# Patient Record
Sex: Female | Born: 1970 | Race: Black or African American | Hispanic: No | Marital: Single | Smoking: Never smoker
Health system: Southern US, Community
[De-identification: ages and names within clinical notes are randomized; demographics above are authoritative.]

---

## 2001-04-09 DIAGNOSIS — IMO0002 Reserved for concepts with insufficient information to code with codable children: Secondary | ICD-10-CM | POA: Insufficient documentation

## 2001-07-07 DIAGNOSIS — D279 Benign neoplasm of unspecified ovary: Secondary | ICD-10-CM | POA: Insufficient documentation

## 2004-09-05 ENCOUNTER — Emergency Department (HOSPITAL_COMMUNITY): Admission: EM | Admit: 2004-09-05 | Discharge: 2004-09-05 | Payer: Self-pay | Admitting: Emergency Medicine

## 2007-10-26 ENCOUNTER — Ambulatory Visit: Payer: Self-pay | Admitting: Internal Medicine

## 2007-10-26 DIAGNOSIS — I10 Essential (primary) hypertension: Secondary | ICD-10-CM | POA: Insufficient documentation

## 2007-10-26 LAB — CONVERTED CEMR LAB
Protein, U semiquant: NEGATIVE
Urobilinogen, UA: 0.2
WBC Urine, dipstick: NEGATIVE
pH: 7.5

## 2007-10-30 ENCOUNTER — Ambulatory Visit: Payer: Self-pay | Admitting: *Deleted

## 2007-10-30 LAB — CONVERTED CEMR LAB
CO2: 25 meq/L (ref 19–32)
Chloride: 100 meq/L (ref 96–112)
Potassium: 3.9 meq/L (ref 3.5–5.3)
Sodium: 140 meq/L (ref 135–145)

## 2007-11-01 ENCOUNTER — Ambulatory Visit: Payer: Self-pay | Admitting: Internal Medicine

## 2007-11-01 DIAGNOSIS — R7309 Other abnormal glucose: Secondary | ICD-10-CM | POA: Insufficient documentation

## 2007-11-01 LAB — CONVERTED CEMR LAB: Blood Glucose, AC Bkfst: 81 mg/dL

## 2007-12-04 ENCOUNTER — Ambulatory Visit: Payer: Self-pay | Admitting: Internal Medicine

## 2007-12-04 ENCOUNTER — Encounter (INDEPENDENT_AMBULATORY_CARE_PROVIDER_SITE_OTHER): Payer: Self-pay | Admitting: Internal Medicine

## 2007-12-04 DIAGNOSIS — R609 Edema, unspecified: Secondary | ICD-10-CM

## 2007-12-04 LAB — CONVERTED CEMR LAB
ALT: 18 units/L (ref 0–35)
AST: 14 units/L (ref 0–37)
Alkaline Phosphatase: 56 units/L (ref 39–117)
BUN: 12 mg/dL (ref 6–23)
Basophils Relative: 0 % (ref 0–1)
Bilirubin Urine: NEGATIVE
Blood in Urine, dipstick: NEGATIVE
CO2: 22 meq/L (ref 19–32)
Chloride: 103 meq/L (ref 96–112)
Creatinine, Ser: 0.75 mg/dL (ref 0.40–1.20)
Glucose, Urine, Semiquant: NEGATIVE
HCT: 40 % (ref 36.0–46.0)
HDL: 43 mg/dL (ref >39)
Ketones, urine, test strip: NEGATIVE
LDL Cholesterol: 138 mg/dL — ABNORMAL HIGH (ref 0–99)
MCHC: 32.3 g/dL (ref 30.0–36.0)
MCV: 85.7 fL (ref 78.0–100.0)
Neutrophils Relative %: 52 % (ref 43–77)
Nitrite: NEGATIVE
Platelets: 293 10*3/uL (ref 150–400)
Potassium: 4.4 meq/L (ref 3.5–5.3)
Sodium: 139 meq/L (ref 135–145)
Specific Gravity, Urine: >=1.03
TSH: 3.584 microintl units/mL (ref 0.350–4.50)
Total Bilirubin: 0.2 mg/dL — ABNORMAL LOW (ref 0.3–1.2)
Urobilinogen, UA: NEGATIVE

## 2007-12-06 ENCOUNTER — Encounter (INDEPENDENT_AMBULATORY_CARE_PROVIDER_SITE_OTHER): Payer: Self-pay | Admitting: Internal Medicine

## 2007-12-25 ENCOUNTER — Ambulatory Visit: Payer: Self-pay | Admitting: Internal Medicine

## 2008-01-04 ENCOUNTER — Ambulatory Visit: Payer: Self-pay | Admitting: Internal Medicine

## 2008-01-04 LAB — CONVERTED CEMR LAB
BUN: 17 mg/dL (ref 6–23)
Glucose, Bld: 80 mg/dL (ref 70–99)
Potassium: 5.3 meq/L (ref 3.5–5.3)

## 2008-01-07 ENCOUNTER — Ambulatory Visit: Payer: Self-pay | Admitting: Internal Medicine

## 2008-02-22 ENCOUNTER — Ambulatory Visit: Payer: Self-pay | Admitting: Internal Medicine

## 2008-02-22 DIAGNOSIS — N926 Irregular menstruation, unspecified: Secondary | ICD-10-CM | POA: Insufficient documentation

## 2008-02-22 DIAGNOSIS — N939 Abnormal uterine and vaginal bleeding, unspecified: Secondary | ICD-10-CM

## 2008-03-24 ENCOUNTER — Ambulatory Visit: Payer: Self-pay | Admitting: Internal Medicine

## 2008-04-07 ENCOUNTER — Ambulatory Visit: Payer: Self-pay | Admitting: Internal Medicine

## 2008-05-29 ENCOUNTER — Telehealth (INDEPENDENT_AMBULATORY_CARE_PROVIDER_SITE_OTHER): Payer: Self-pay | Admitting: Internal Medicine

## 2008-06-02 ENCOUNTER — Encounter (INDEPENDENT_AMBULATORY_CARE_PROVIDER_SITE_OTHER): Payer: Self-pay | Admitting: Internal Medicine

## 2008-12-26 ENCOUNTER — Ambulatory Visit: Payer: Self-pay | Admitting: Internal Medicine

## 2008-12-29 ENCOUNTER — Ambulatory Visit: Payer: Self-pay | Admitting: Physician Assistant

## 2012-01-30 ENCOUNTER — Ambulatory Visit: Payer: Self-pay | Admitting: Specialist

## 2012-01-30 LAB — CBC WITH DIFFERENTIAL/PLATELET
Eosinophil #: 0.2 10*3/uL (ref 0.0–0.7)
HCT: 38.9 % (ref 35.0–47.0)
Lymphocyte #: 2.2 10*3/uL (ref 1.0–3.6)
Lymphocyte %: 33.4 %
MCHC: 32.6 g/dL (ref 32.0–36.0)
Neutrophil #: 3.7 10*3/uL (ref 1.4–6.5)
Platelet: 275 10*3/uL (ref 150–440)
RDW: 15 % — ABNORMAL HIGH (ref 11.5–14.5)
WBC: 6.7 10*3/uL (ref 3.6–11.0)

## 2012-01-30 LAB — IRON AND TIBC
Iron Bind.Cap.(Total): 392 ug/dL
Iron Saturation: 9 %
Iron: 37 ug/dL — ABNORMAL LOW
Unbound Iron-Bind.Cap.: 355 ug/dL

## 2012-01-30 LAB — COMPREHENSIVE METABOLIC PANEL
Anion Gap: 9 (ref 7–16)
Calcium, Total: 9.3 mg/dL (ref 8.5–10.1)
Chloride: 99 mmol/L (ref 98–107)
EGFR (African American): >60
EGFR (Non-African Amer.): >60
Glucose: 97 mg/dL (ref 65–99)
Potassium: 3.4 mmol/L — ABNORMAL LOW (ref 3.5–5.1)
SGOT(AST): 16 U/L (ref 15–37)

## 2012-01-30 LAB — HEMOGLOBIN A1C: Hemoglobin A1C: 6.1 %

## 2012-01-30 LAB — TSH: Thyroid Stimulating Horm: 2.05 u[IU]/mL

## 2012-01-30 LAB — FERRITIN: Ferritin (ARMC): 75 ng/mL

## 2012-01-30 LAB — HEPATIC FUNCTION PANEL A (ARMC): Bilirubin, Direct: <0.1 mg/dL (ref 0.00–0.20)

## 2012-01-30 LAB — PROTIME-INR: INR: 0.9

## 2012-01-30 LAB — APTT: Activated PTT: 24.7 s

## 2012-01-30 LAB — MAGNESIUM: Magnesium: 1.9 mg/dL

## 2012-01-30 LAB — LIPASE, BLOOD: Lipase: 114 U/L

## 2012-01-30 LAB — AMYLASE: Amylase: 61 U/L

## 2012-01-30 LAB — FOLATE: Folic Acid: 19 ng/mL

## 2012-01-30 LAB — PHOSPHORUS: Phosphorus: 3.3 mg/dL

## 2012-02-28 ENCOUNTER — Ambulatory Visit: Payer: Self-pay | Admitting: Specialist

## 2012-03-09 ENCOUNTER — Ambulatory Visit: Payer: Self-pay | Admitting: Specialist

## 2014-06-13 ENCOUNTER — Other Ambulatory Visit: Payer: Self-pay

## 2014-06-13 DIAGNOSIS — Z1231 Encounter for screening mammogram for malignant neoplasm of breast: Secondary | ICD-10-CM

## 2014-06-20 ENCOUNTER — Ambulatory Visit
Admission: RE | Admit: 2014-06-20 | Discharge: 2014-06-20 | Disposition: A | Discharge status: Home / Self Care | Payer: BLUE CROSS/BLUE SHIELD | Source: Ambulatory Visit

## 2014-06-20 ENCOUNTER — Encounter (INDEPENDENT_AMBULATORY_CARE_PROVIDER_SITE_OTHER): Payer: Self-pay

## 2014-06-20 DIAGNOSIS — Z1231 Encounter for screening mammogram for malignant neoplasm of breast: Secondary | ICD-10-CM

## 2015-08-18 ENCOUNTER — Ambulatory Visit: Payer: Self-pay | Admitting: Physician Assistant

## 2015-08-18 ENCOUNTER — Encounter: Payer: Self-pay | Admitting: Physician Assistant

## 2015-08-18 VITALS — BP 130/90 | HR 79 | Temp 97.9°F

## 2015-08-18 DIAGNOSIS — J302 Other seasonal allergic rhinitis: Secondary | ICD-10-CM

## 2015-08-18 DIAGNOSIS — R062 Wheezing: Secondary | ICD-10-CM

## 2015-08-18 MED ORDER — LEVOCETIRIZINE DIHYDROCHLORIDE 5 MG PO TABS: 5.0000 mg | ORAL_TABLET | Freq: Every evening | ORAL | Status: DC

## 2015-08-18 MED ORDER — ALBUTEROL SULFATE HFA 108 (90 BASE) MCG/ACT IN AERS: 2.0000 | INHALATION_SPRAY | Freq: Four times a day (QID) | RESPIRATORY_TRACT | Status: AC | PRN

## 2015-08-18 NOTE — Progress Notes (Signed)
S: c/o seasonal allergies, cough, wheezing, runny nose and congestion, tried her flonase and otc mucinex without relief, otc allergy pills haven't worked well for her, no fever/chills/body aches  O: vitals wnl, nad, tms dull, nasal mucosa pale/boggy, throat wnl, neck supple no lymph, lungs c t a, cv rrr  A: seasonal allergies, hx of wheezing  P: xyzal, albuterol, return if worsening or not better in 1 week

## 2016-01-04 ENCOUNTER — Encounter: Payer: Self-pay | Admitting: Physician Assistant

## 2016-01-04 ENCOUNTER — Ambulatory Visit: Payer: Self-pay | Admitting: Physician Assistant

## 2016-01-04 VITALS — BP 124/90 | Temp 98.3°F

## 2016-01-04 DIAGNOSIS — S76311A Strain of muscle, fascia and tendon of the posterior muscle group at thigh level, right thigh, initial encounter: Secondary | ICD-10-CM

## 2016-01-04 MED ORDER — MELOXICAM 15 MG PO TABS: 15.0000 mg | ORAL_TABLET | Freq: Every day | ORAL | 0 refills | Status: AC

## 2016-01-04 MED ORDER — BACLOFEN 10 MG PO TABS: 10.0000 mg | ORAL_TABLET | Freq: Three times a day (TID) | ORAL | 0 refills | Status: AC

## 2016-01-04 NOTE — Progress Notes (Signed)
S: c/o r hamstring pain, states area feels tight and spasms after being on it awhile, sx started when she bent down to tie her shoe, states her back does not hurt, pain is cramping, not a burning or tingling sensation, used biofreeze without relief, used asa without relief  O: vitals wnl, nad, spine nontender, r hamstring tender, it band tender at area near trochanter, full rom, able to walk without difficulty, n/v intact  A: hamstring strain  P: discussed exercises and stretching technique, wet heat followed by ice, mobic 15mg  qd, baclofen 10mg  tid

## 2016-09-01 ENCOUNTER — Other Ambulatory Visit: Payer: Self-pay | Admitting: Physician Assistant

## 2016-09-01 DIAGNOSIS — J302 Other seasonal allergic rhinitis: Secondary | ICD-10-CM

## 2016-09-05 NOTE — Telephone Encounter (Signed)
Med refill for xyzal approved 

## 2017-02-14 ENCOUNTER — Other Ambulatory Visit: Payer: Self-pay | Admitting: Surgical Oncology

## 2017-02-14 DIAGNOSIS — I10 Essential (primary) hypertension: Secondary | ICD-10-CM

## 2017-02-28 ENCOUNTER — Ambulatory Visit
Admission: RE | Admit: 2017-02-28 | Discharge: 2017-02-28 | Disposition: A | Discharge status: Home / Self Care | Payer: BLUE CROSS/BLUE SHIELD | Source: Ambulatory Visit | Attending: Surgical Oncology | Admitting: Surgical Oncology

## 2017-02-28 DIAGNOSIS — I10 Essential (primary) hypertension: Secondary | ICD-10-CM

## 2019-03-10 IMAGING — RF DG UGI W/ HIGH DENSITY W/KUB
6 series · 14 of 19 positions shown · non-contrast
Comparison: Upper GI of 01/30/2012

CLINICAL DATA: Pre gastric sleeve surgery for obesity

EXAM:
UPPER GI SERIES WITH KUB
TECHNIQUE: After obtaining a scout radiograph a routine upper GI series was
performed using thin and high density barium.
FLUOROSCOPY TIME:  Fluoroscopy Time:  2 minutes 6 second
Radiation Exposure Index (if provided by the fluoroscopic device):
542 mGy
Number of Acquired Spot Images: 0

[Series 1: one shot · 1 of 1 slices shown (1 of 3)]
[im 1/1]
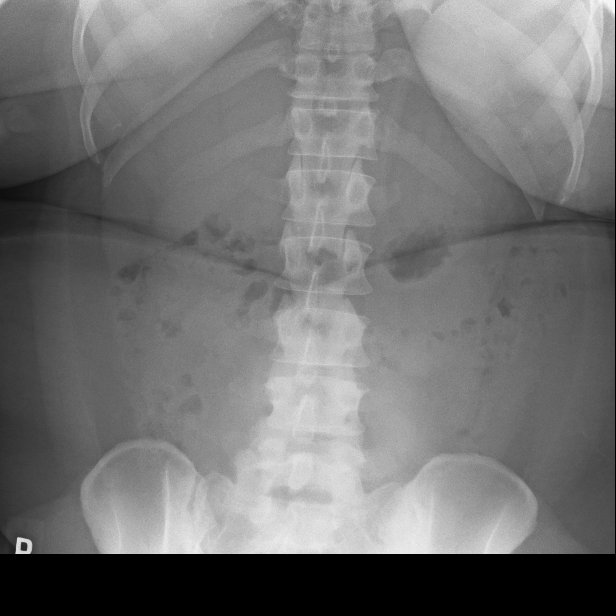

[Series 2: sequence · 2 of 3 frames shown (1 of 3)]
[frame 2/3]
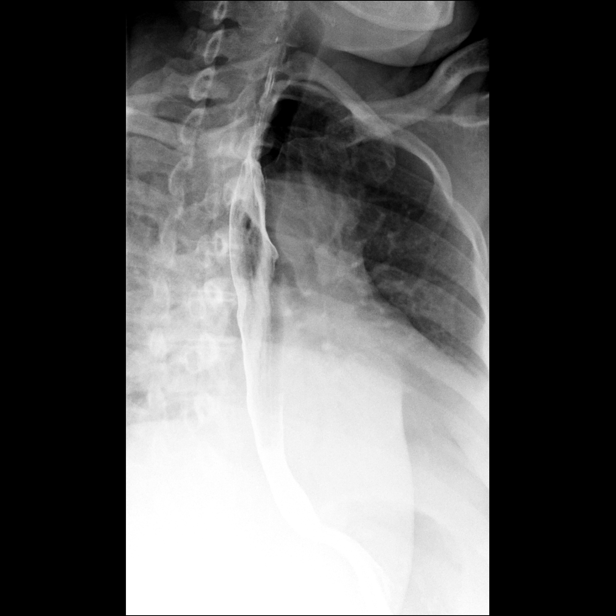
[frame 3/3]
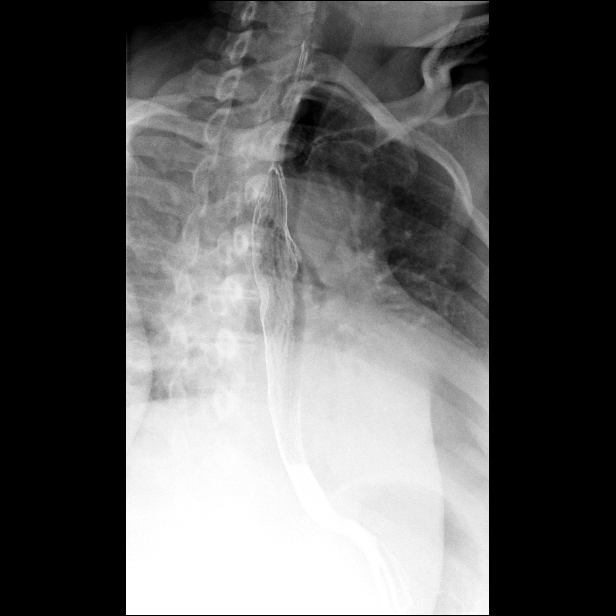

[Series 3: sequence · 3 of 7 frames shown (2 of 3)]
[frame 2/7]
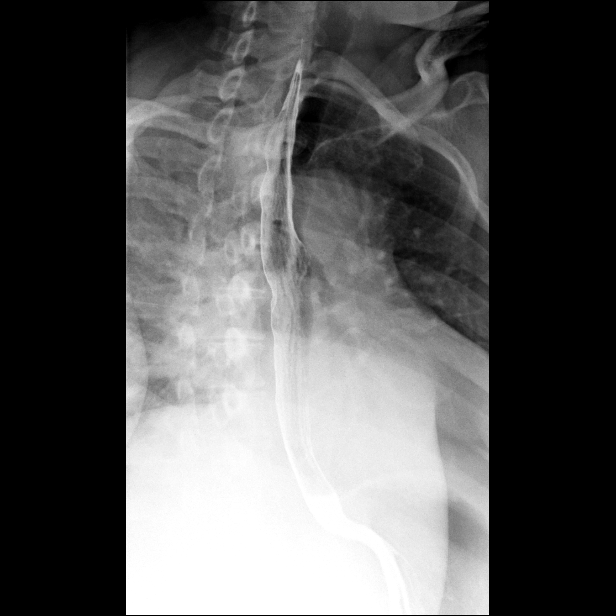
[frame 6/7]
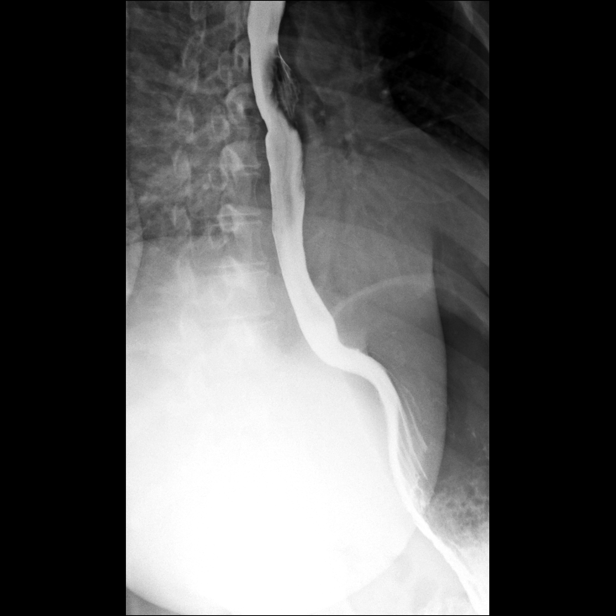
[frame 7/7]
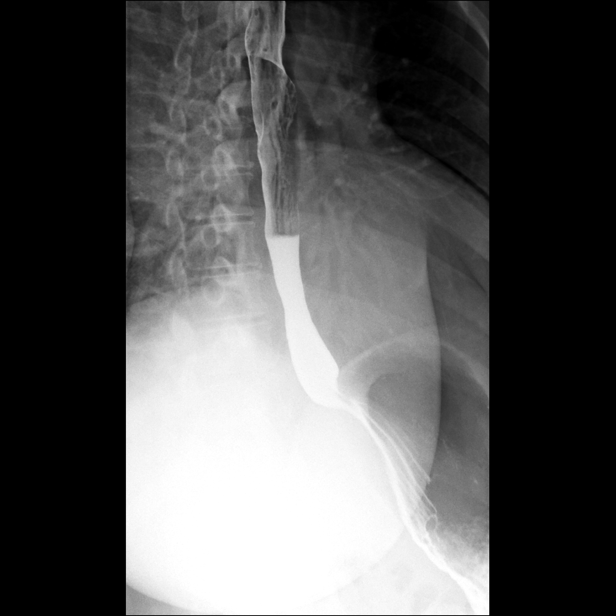

[Series 4: one shot · 3 of 4 slices shown (2 of 3)]
[im 1/4]
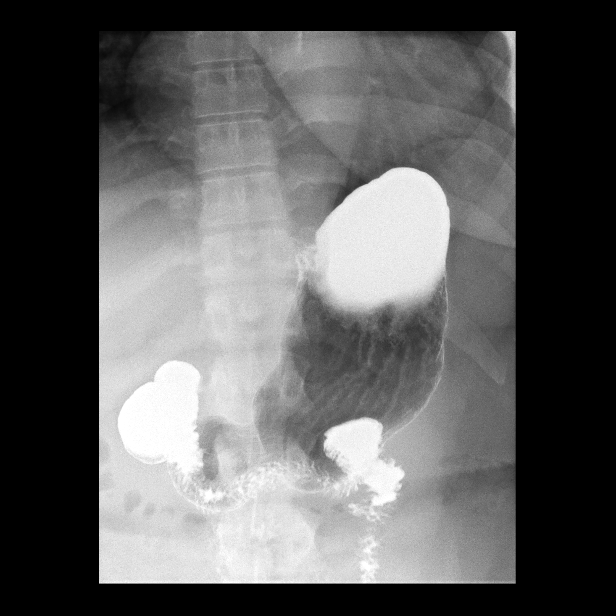
[im 3/4]
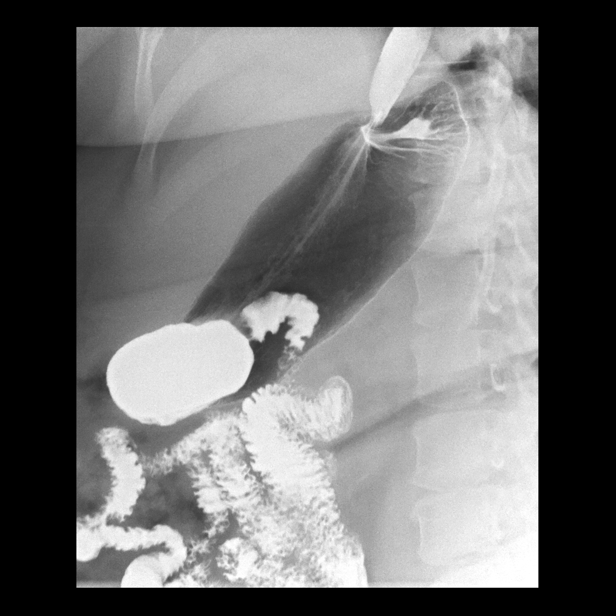
[im 4/4]
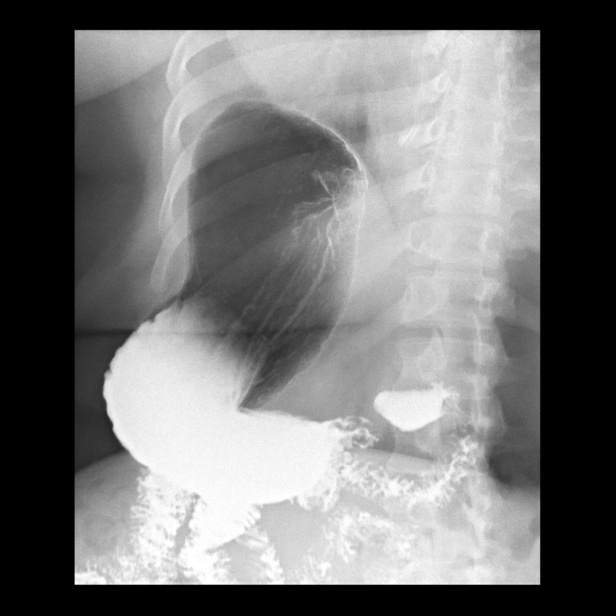

[Series 5: sequence · 2 of 55 frames shown (3 of 3)]
[frame 9/55]
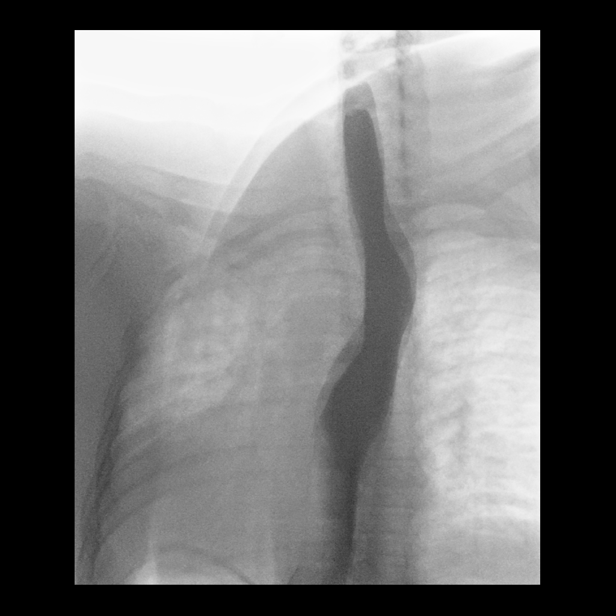
[frame 47/55]
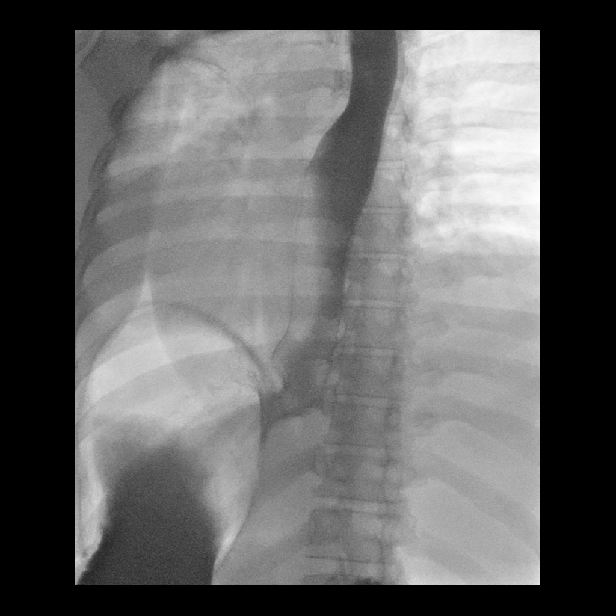

[Series 6: one shot · 3 of 4 slices shown (3 of 3)]
[im 1/4]
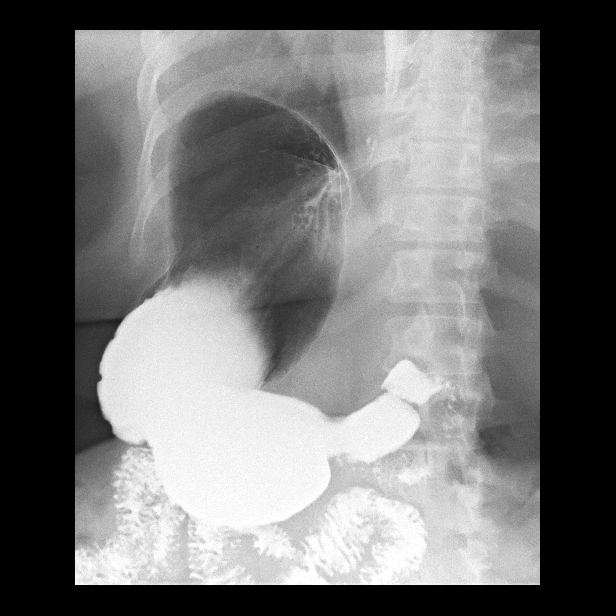
[im 2/4]
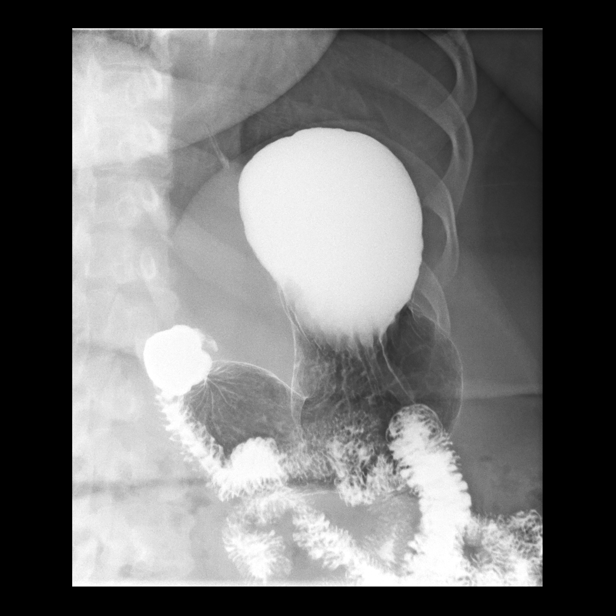
[im 4/4]
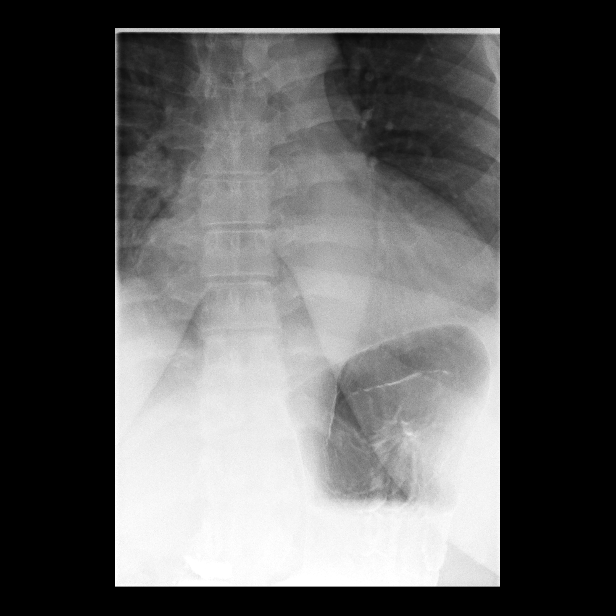

[14 of 19 positions shown; findings below may reference images not displayed]

FINDINGS: A preliminary film the abdomen shows a nonspecific bowel gas
pattern. No opaque calculi are seen.

A double-contrast study was performed. The mucosa of the esophagus
is unremarkable. No hiatal hernia is seen. Only faint
gastroesophageal reflux is demonstrated at the end of the study. A
barium pill was given which did pass into the stomach without delay.

The stomach is normal in contour and peristalsis. The duodenal bulb
fills and the duodenal loop is in normal position.
IMPRESSION: 1. Only faint gastroesophageal reflux. Barium pill passes into the
stomach without delay.
2. Otherwise negative double-contrast upper GI.
